# Patient Record
Sex: Female | Born: 1970 | Race: White | Hispanic: No | Marital: Married | Smoking: Never smoker
Health system: Southern US, Community
[De-identification: ages and names within clinical notes are randomized; demographics above are authoritative.]

## PROBLEM LIST (undated history)

## (undated) DIAGNOSIS — K648 Other hemorrhoids: Secondary | ICD-10-CM

## (undated) DIAGNOSIS — Z9889 Other specified postprocedural states: Secondary | ICD-10-CM

## (undated) DIAGNOSIS — K649 Unspecified hemorrhoids: Secondary | ICD-10-CM

## (undated) HISTORY — PX: OTHER SURGICAL HISTORY: SHX169

## (undated) HISTORY — DX: Other hemorrhoids: K64.8

## (undated) HISTORY — PX: WISDOM TOOTH EXTRACTION: SHX21

## (undated) HISTORY — PX: UPPER GASTROINTESTINAL ENDOSCOPY: SHX188

## (undated) HISTORY — DX: Unspecified hemorrhoids: K64.9

## (undated) HISTORY — PX: CHOLECYSTECTOMY: SHX55

---

## 2000-07-26 DIAGNOSIS — C679 Malignant neoplasm of bladder, unspecified: Secondary | ICD-10-CM

## 2000-07-26 HISTORY — DX: Malignant neoplasm of bladder, unspecified: C67.9

## 2004-05-28 ENCOUNTER — Ambulatory Visit (HOSPITAL_COMMUNITY): Admission: RE | Admit: 2004-05-28 | Discharge: 2004-05-28 | Payer: Self-pay | Admitting: Obstetrics & Gynecology

## 2004-09-09 ENCOUNTER — Ambulatory Visit: Payer: Self-pay | Admitting: Internal Medicine

## 2004-09-14 ENCOUNTER — Ambulatory Visit (HOSPITAL_COMMUNITY): Admission: RE | Admit: 2004-09-14 | Discharge: 2004-09-14 | Payer: Self-pay | Admitting: Internal Medicine

## 2004-10-14 ENCOUNTER — Ambulatory Visit: Payer: Self-pay | Admitting: Internal Medicine

## 2004-12-08 ENCOUNTER — Ambulatory Visit (HOSPITAL_COMMUNITY): Admission: RE | Admit: 2004-12-08 | Discharge: 2004-12-08 | Payer: Self-pay | Admitting: Internal Medicine

## 2004-12-08 ENCOUNTER — Ambulatory Visit: Payer: Self-pay | Admitting: Internal Medicine

## 2004-12-17 ENCOUNTER — Ambulatory Visit (HOSPITAL_COMMUNITY): Admission: RE | Admit: 2004-12-17 | Discharge: 2004-12-17 | Payer: Self-pay | Admitting: Internal Medicine

## 2004-12-24 ENCOUNTER — Ambulatory Visit (HOSPITAL_COMMUNITY): Admission: RE | Admit: 2004-12-24 | Discharge: 2004-12-24 | Payer: Self-pay | Admitting: Internal Medicine

## 2005-01-28 ENCOUNTER — Ambulatory Visit (HOSPITAL_COMMUNITY): Admission: RE | Admit: 2005-01-28 | Discharge: 2005-01-28 | Payer: Self-pay | Admitting: General Surgery

## 2005-02-08 ENCOUNTER — Ambulatory Visit: Payer: Self-pay | Admitting: Internal Medicine

## 2005-03-22 ENCOUNTER — Ambulatory Visit: Payer: Self-pay | Admitting: Internal Medicine

## 2005-03-25 ENCOUNTER — Ambulatory Visit (HOSPITAL_COMMUNITY): Admission: RE | Admit: 2005-03-25 | Discharge: 2005-03-25 | Payer: Self-pay | Admitting: Internal Medicine

## 2005-08-03 ENCOUNTER — Ambulatory Visit: Payer: Self-pay | Admitting: Internal Medicine

## 2005-08-12 ENCOUNTER — Ambulatory Visit (HOSPITAL_COMMUNITY): Admission: RE | Admit: 2005-08-12 | Discharge: 2005-08-12 | Payer: Self-pay | Admitting: Internal Medicine

## 2005-08-22 ENCOUNTER — Ambulatory Visit: Payer: Self-pay | Admitting: Internal Medicine

## 2005-09-14 ENCOUNTER — Ambulatory Visit: Payer: Self-pay | Admitting: Internal Medicine

## 2005-09-14 ENCOUNTER — Ambulatory Visit (HOSPITAL_COMMUNITY): Admission: RE | Admit: 2005-09-14 | Discharge: 2005-09-14 | Payer: Self-pay | Admitting: Internal Medicine

## 2005-11-10 ENCOUNTER — Encounter (HOSPITAL_COMMUNITY): Admission: RE | Admit: 2005-11-10 | Discharge: 2005-11-10 | Payer: Self-pay | Admitting: Internal Medicine

## 2005-11-14 IMAGING — NM NM HEPATO W/GB/PHARM/[PERSON_NAME]
2 series · 12 of 12 positions shown · non-contrast
Comparison: none

HISTORY: Epigastric pain

[Series 1: hepatobiliary · 3.20mm/px · 6 of 60 frames shown (1 of 2)]
[frame 6/60]
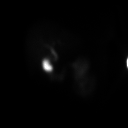
[frame 16/60]
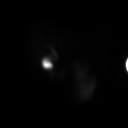
[frame 26/60]
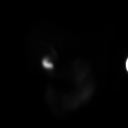
[frame 36/60]
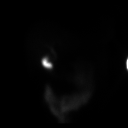
[frame 46/60]
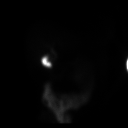
[frame 56/60]
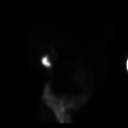

[Series 1: hepatobiliary · 3.20mm/px · 6 of 60 frames shown (2 of 2)]
[frame 6/60]
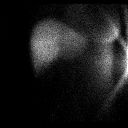
[frame 16/60]
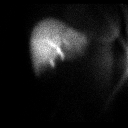
[frame 26/60]
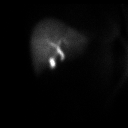
[frame 36/60]
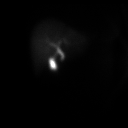
[frame 46/60]
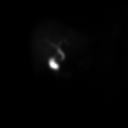
[frame 56/60]
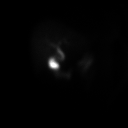

[12 of 12 positions shown; findings below may reference images not displayed]

NUCLEAR MEDICINE HEPATOBILIARY SCAN WITH EJECTION FRACTION:

Hepatobiliary imaging performed using 5 mCi Tc 99m mebrofenin.
Prompt tracer extraction from blood stream indicating normal hepatocellular
function.
Prompt excretion into biliary tree.
Gallbladder visualized x 20 minutes.
Small bowel seen x 45 minutes.
No hepatic retention of tracer.

At 1 hour, patient ingested half-and-half and imaging was performed for 60
minutes.
Only mild emptying of tracer occurs from gallbladder following fatty meal
simulation.
Calculated gallbladder ejection fraction is 29% which is abnormally low.
IMPRESSION: Patent biliary tree.
Abnormal gallbladder response to fatty meal stimulation with decreased
gallbladder ejection fraction of 29%.

## 2005-12-04 ENCOUNTER — Inpatient Hospital Stay (HOSPITAL_COMMUNITY): Admission: RE | Admit: 2005-12-04 | Discharge: 2005-12-04 | Payer: Self-pay | Admitting: Obstetrics & Gynecology

## 2005-12-26 ENCOUNTER — Ambulatory Visit: Payer: Self-pay | Admitting: Internal Medicine

## 2011-09-28 ENCOUNTER — Other Ambulatory Visit: Payer: Self-pay | Admitting: Specialist

## 2011-10-11 ENCOUNTER — Encounter: Payer: Self-pay | Admitting: Specialist

## 2011-10-27 ENCOUNTER — Other Ambulatory Visit (HOSPITAL_COMMUNITY): Payer: Self-pay | Admitting: Specialist

## 2011-10-27 DIAGNOSIS — O09529 Supervision of elderly multigravida, unspecified trimester: Secondary | ICD-10-CM

## 2011-10-27 DIAGNOSIS — Z3689 Encounter for other specified antenatal screening: Secondary | ICD-10-CM

## 2011-12-02 ENCOUNTER — Ambulatory Visit (HOSPITAL_COMMUNITY)
Admission: RE | Admit: 2011-12-02 | Discharge: 2011-12-02 | Disposition: A | Discharge status: Home / Self Care | Payer: BC Managed Care – PPO | Source: Ambulatory Visit | Attending: Specialist | Admitting: Specialist

## 2011-12-02 DIAGNOSIS — Z3689 Encounter for other specified antenatal screening: Secondary | ICD-10-CM

## 2011-12-02 DIAGNOSIS — O358XX0 Maternal care for other (suspected) fetal abnormality and damage, not applicable or unspecified: Secondary | ICD-10-CM | POA: Insufficient documentation

## 2011-12-02 DIAGNOSIS — O09529 Supervision of elderly multigravida, unspecified trimester: Secondary | ICD-10-CM | POA: Insufficient documentation

## 2011-12-02 DIAGNOSIS — Z1389 Encounter for screening for other disorder: Secondary | ICD-10-CM | POA: Insufficient documentation

## 2011-12-02 DIAGNOSIS — Z363 Encounter for antenatal screening for malformations: Secondary | ICD-10-CM | POA: Insufficient documentation

## 2011-12-05 LAB — US OB COMP LESS 14 WKS

## 2011-12-09 NOTE — Progress Notes (Signed)
Genetic Counseling  High-Risk Gestation Note  Appointment Date:  12/02/2011 Referred By: Jeannett Senior, MD Date of Birth:  Jul 21, 1971 Partner:  Carrie Mclaughlin Attending: Particia Nearing, MD   [name] and her husband, [name], were seen for genetic counseling because of a maternal age of 41 y.o.Marland Kitchen     They were counseled regarding maternal age and the association with risk for chromosome conditions due to nondisjunction with aging of the ova.   We reviewed chromosomes, nondisjunction, and the associated 1 in 33 risk for fetal aneuploidy related to a maternal age of 41 y.o. at [redacted]w[redacted]d gestation.  They were counseled that the risk for aneuploidy decreases as gestational age increases, accounting for those pregnancies which spontaneously abort.  We specifically discussed Down syndrome (trisomy 29), trisomies 80 and 62, and sex chromosome aneuploidies (47,XXX and 47,XXY) including the common features and prognoses of each.   We reviewed available screening and diagnostic options.  Regarding screening tests, we discussed the options of Quad screen and ultrasound.  They understand that screening tests are used to modify a patient's a priori risk for aneuploidy, typically based on age.  This estimate provides a pregnancy specific risk assessment. We discussed another type of screening test, noninvasive prenatal testing (NIPT), which utilizes cell free fetal DNA found in the maternal circulation. This test is not diagnostic for chromosome conditions, but can provide information regarding the presence or absence of extra fetal DNA for chromosomes 13, 18 and 21. Thus, it would not identify or rule out aneuploidy. The reported detection rate is greater than 99% for Trisomy 21, greater than 97% for Trisomy 18, and is approximately 80% (8 out of 10) for Trisomy 13. The false positive rate is thought to be less than 1% for any of these conditions. We also reviewed the availability of diagnostic options  including amniocentesis.  We discussed the risks, limitations, and benefits of each.  After reviewing these options, [name] elected to have ultrasound, but declined amniocentesis, Quad screen, and cell free fetal DNA testing.  She understands that ultrasound cannot rule out all birth defects or genetic syndromes. The patient was advised of this limitation and states she still does not want diagnostic testing at this time.  However, they were counseled that 50-80% of fetuses with Down syndrome and up to 90% of fetuses with trisomies 13 and 18, when well visualized, have detectable anomalies or soft markers by ultrasound.   [name] was provided with written information regarding cystic fibrosis (CF) including the carrier frequency and incidence in the Caucasian population, the availability of carrier testing and prenatal diagnosis if indicated.  In addition, we discussed that CF is routinely screened for as part of the [place] newborn screening panel.  She declined testing today.   Both family histories were reviewed and found to be contributory for a maternal first cousin to the patient with cleft palate. He is otherwise healthy. The underlying cause is not known for his cleft palate. The incidence of isolated cleft palate is estimated to be 1 in 2,500, and the incidence of cleft lip with or without cleft palate is approximately 1 in 1,000, varying with ethnicity. Cleft palate is most often an isolated condition, but can be present as one feature of an underlying genetic condition in combination with other birth defects or features. Clefting can also be associated with maternal environmental exposures during pregnancy. When there is no syndrome or known underlying factor as the cause,  multifactorial inheritance is suspected involving a combination of genetic and environmental contributing factors. In the case of multifactorial inheritance, given the reported family history, recurrence risk  for isolated cleft palate in the current pregnancy, a fourth degree relative to the affected individual is not estimated to be increased above the general population risk. If this relative's cleft palate were due to a specific underlying cause, this recurrence risk estimate may change. A second trimester targeted ultrasound may detect facial clefts. However, it is important to remember that not all clefting can be detected prenatally, and isolated cleft palate is difficult to detect on prenatal ultrasound.  Additionally, the patient reported a paternal great-aunt (her paternal grandmother's sister) with mental retardation. She died due in adulthood due to diabetes. An underlying cause was not known for her mental retardation. We discussed that there are many different causes of mental retardation such as genetic differences, sporadic causes, or injuries.  A specific diagnosis for mental retardation can be determined in approximately 50% of cases.  In the remaining 50% of cases, a diagnosis may not ever be determined.  Without more specific information, potential genetic risks cannot be determined.  Without further information regarding the provided family history, an accurate genetic risk cannot be calculated. Further genetic counseling is warranted if more information is obtained.  Mrs. Whetsel denied exposure to environmental toxins or chemical agents. She denied the use of alcohol, tobacco or street drugs. She denied significant viral illnesses during the course of her pregnancy. Her medical and surgical histories were noncontributory.   I counseled this couple regarding the above risks and available options.  The approximate face-to-face time with the genetic counselor was 25 minutes.  Quinn Plowman, MS,  Certified Genetic Counselor 12/09/2011

## 2012-04-12 ENCOUNTER — Other Ambulatory Visit (HOSPITAL_COMMUNITY): Payer: Self-pay | Admitting: Specialist

## 2012-04-12 DIAGNOSIS — Z3689 Encounter for other specified antenatal screening: Secondary | ICD-10-CM

## 2012-04-12 DIAGNOSIS — O2690 Pregnancy related conditions, unspecified, unspecified trimester: Secondary | ICD-10-CM

## 2012-04-13 ENCOUNTER — Ambulatory Visit (HOSPITAL_COMMUNITY)
Admission: RE | Admit: 2012-04-13 | Discharge: 2012-04-13 | Disposition: A | Discharge status: Home / Self Care | Payer: BC Managed Care – PPO | Source: Ambulatory Visit | Attending: Specialist | Admitting: Specialist

## 2012-04-13 DIAGNOSIS — O09529 Supervision of elderly multigravida, unspecified trimester: Secondary | ICD-10-CM | POA: Insufficient documentation

## 2012-04-13 DIAGNOSIS — Z3689 Encounter for other specified antenatal screening: Secondary | ICD-10-CM

## 2012-04-13 DIAGNOSIS — O2690 Pregnancy related conditions, unspecified, unspecified trimester: Secondary | ICD-10-CM

## 2012-04-13 DIAGNOSIS — O368390 Maternal care for abnormalities of the fetal heart rate or rhythm, unspecified trimester, not applicable or unspecified: Secondary | ICD-10-CM | POA: Insufficient documentation

## 2012-04-13 NOTE — Progress Notes (Signed)
Patient seen today  for follow up ultrasound.  See full report in [facility].  Alpha Gula, MD  Patient is seen today due to irregular fetal heart rate that was noted in clinic yesterday.  On exam today, frequent premature atrial contractions were noted and documented on M-mode ultrasound.  The fetal heart anatomy appears normal.  Premature atrial contractions are a benign dysrhythmia that usually resolves spontaneously after delivery.  IUP at 36 6/7 weeks Fetal growth is appropriate (88th percentile) Normal amniotic fluid volume Frequent premature atrial contactions noted on M-mode ultrasound  Recommend weekly doptones in clinic - no need for further intervention unless persistent fetal tachycardia noted (>200 bpm).  Otherwise follow up ultrasounds as clinically indicated.

## 2020-11-04 ENCOUNTER — Encounter: Payer: Self-pay | Admitting: Gastroenterology

## 2020-11-04 ENCOUNTER — Ambulatory Visit: Payer: BC Managed Care – PPO | Admitting: Gastroenterology

## 2020-11-04 VITALS — BP 120/74 | HR 72 | Ht 66.0 in | Wt 133.0 lb

## 2020-11-04 DIAGNOSIS — K625 Hemorrhage of anus and rectum: Secondary | ICD-10-CM | POA: Diagnosis not present

## 2020-11-04 MED ORDER — PLENVU 140 G PO SOLR: 1.0000 | ORAL | 0 refills | Status: DC

## 2020-11-04 NOTE — Patient Instructions (Signed)
You have been scheduled for a colonoscopy. Please follow written instructions given to you at your visit today.  Please pick up your prep supplies at the pharmacy within the next 1-3 days. If you use inhalers (even only as needed), please bring them with you on the day of your procedure.  ____________________________________________________  Carrie Mclaughlin have been scheduled for hemorrhoidal banding with Dr Loletha Carrow on 11/24/2020 @ 4:00 pm.  ____________________________________________________  If you are age 49 or younger, your body mass index should be between 19-25. Your Body mass index is 21.47 kg/m. If this is out of the aformentioned range listed, please consider follow up with your Primary Care Provider.   ____________________________________________________  Due to recent changes in healthcare laws, you may see the results of your imaging and laboratory studies on MyChart before your provider has had a chance to review them.  We understand that in some cases there may be results that are confusing or concerning to you. Not all laboratory results come back in the same time frame and the provider may be waiting for multiple results in order to interpret others.  Please give [place] 48 hours in order for your provider to thoroughly review all the results before contacting the office for clarification of your results.   It was a pleasure to see you today!  Dr. Loletha Carrow

## 2020-11-04 NOTE — Progress Notes (Signed)
[facility] Consult Note:  History: Carrie Mclaughlin 11/04/2020  Referring provider: Yvone Neu, MD  Reason for consult/chief complaint: Hemorrhoids (Pateint has hx of banding, 8 years prior,patient has active bleeding presently)   Subjective  HPI: Carrie Mclaughlin was here for painless rectal bleeding that has occurred in the past and was hemorrhoidal in nature.  It has improved for periods of time after prior banding only to recur years later.  It has been occurring again over the last several months, sometimes heavy bleeding, then none for days or up to a couple of weeks.  She denies chronic abdominal pain, her bowel habits are regular without straining.  She denies chronic upper digestive symptoms. She also feels prolapse of the tissue and that seems to cause feelings of incomplete evacuation where she may have to have a bowel movement again soon afterwards. Carrie Mclaughlin saw Dr. Gala Romney several times between 2005 and 2007 and says that hemorrhoidal banding was performed in [place] about 2012, and again several years later in [place]. ROS:  Review of Systems  Constitutional: Negative for appetite change and unexpected weight change.  HENT: Negative for mouth sores and voice change.   Eyes: Negative for pain and redness.  Respiratory: Negative for cough and shortness of breath.   Cardiovascular: Negative for chest pain and palpitations.  Genitourinary: Negative for dysuria and hematuria.  Musculoskeletal: Negative for arthralgias and myalgias.  Skin: Negative for pallor and rash.  Neurological: Negative for weakness and headaches.  Hematological: Negative for adenopathy.     Past Medical History: Past Medical History:  Diagnosis Date  . Bladder cancer ([place])   . Hemorrhoids    No chronic medical problems  Past Surgical History: No prior surgery. Hemorrhoidal banding as noted above   Family History: Family History  Problem Relation  Age of Onset  . Diabetes Mother   . Parkinson's disease Father    No family history colorectal cancer (Patient has not had prior colonoscopy)  Social History: Social History   Socioeconomic History  . Marital status: Married    Spouse name: Not on file  . Number of children: Not on file  . Years of education: Not on file  . Highest education level: Not on file  Occupational History  . Not on file  Tobacco Use  . Smoking status: Never Smoker  . Smokeless tobacco: Never Used  Substance and Sexual Activity  . Alcohol use: Not Currently  . Drug use: Never  . Sexual activity: Not on file  Other Topics Concern  . Not on file  Social History Narrative  . Not on file   Social Determinants of Health   Financial Resource Strain: Not on file  Food Insecurity: Not on file  Transportation Needs: Not on file  Physical Activity: Not on file  Stress: Not on file  Social Connections: Not on file    Allergies: No Known Allergies  Outpatient Meds: Current Outpatient Medications  Medication Sig Dispense Refill  . MULTIPLE VITAMIN PO Take 1 tablet by mouth daily.     No current facility-administered medications for this visit.      ___________________________________________________________________ Objective   Exam:  BP 120/74   Pulse 72   Ht 5\' 6"  (1.676 m)   Wt 133 lb (60.3 kg)   LMP 10/25/2020   BMI 21.47 kg/m    General: Well-appearing  Eyes: sclera anicteric, no redness  ENT: oral mucosa moist without lesions, no cervical or supraclavicular lymphadenopathy  CV: RRR without  murmur, S1/S2, no JVD, no peripheral edema  Resp: clear to auscultation bilaterally, normal RR and effort noted  GI: soft, no tenderness, with active bowel sounds. No guarding or palpable organomegaly noted.  Skin; warm and dry, no rash or jaundice noted  Neuro: awake, alert and oriented x 3. Normal gross motor function and fluent speech DRE: Redundant perianal skin folds.   Partially prolapsed internal hemorrhoid tissue seen, does not worsen with simulated defecation.  No fissure tenderness or palpable internal lesions Labs: No data for review  Assessment: Encounter Diagnosis  Name Primary?  . Rectal bleeding Yes    Recurrence of hemorrhoidal bleeding despite banding twice in the past.  Plan:  Colonoscopy to rule out any other sources of bleeding. Possible hemorrhoidal banding to follow if looks amenable to that versus colorectal surgery consult since this has recurred despite prior banding.  Thank you for the courtesy of this consult.  Please call me with any questions or concerns.  [name]  CC: Referring provider noted above

## 2020-11-17 ENCOUNTER — Encounter: Payer: BC Managed Care – PPO | Admitting: Gastroenterology

## 2020-11-24 ENCOUNTER — Encounter: Payer: BC Managed Care – PPO | Admitting: Gastroenterology

## 2020-12-07 ENCOUNTER — Telehealth: Payer: Self-pay | Admitting: Gastroenterology

## 2020-12-07 NOTE — Telephone Encounter (Signed)
Pt is wanting to inform the nurse that the medication PLENVU is on back order, pt would like to know if something else could be called in.

## 2020-12-08 NOTE — Telephone Encounter (Signed)
Patient has a procedure on 12-14-2020. Asked to return call with either a request for a new Rx or a sample of Plenvu

## 2020-12-09 ENCOUNTER — Telehealth: Payer: Self-pay | Admitting: Gastroenterology

## 2020-12-09 NOTE — Telephone Encounter (Signed)
Prep has been placed at the front desk for pick up

## 2020-12-10 ENCOUNTER — Encounter: Payer: BC Managed Care – PPO | Admitting: Gastroenterology

## 2020-12-14 ENCOUNTER — Other Ambulatory Visit: Payer: Self-pay

## 2020-12-14 ENCOUNTER — Ambulatory Visit (AMBULATORY_SURGERY_CENTER): Payer: BC Managed Care – PPO | Admitting: Gastroenterology

## 2020-12-14 ENCOUNTER — Encounter: Payer: Self-pay | Admitting: Gastroenterology

## 2020-12-14 VITALS — BP 95/59 | HR 68 | Temp 97.8°F | Resp 16 | Ht 66.0 in | Wt 133.0 lb

## 2020-12-14 DIAGNOSIS — K625 Hemorrhage of anus and rectum: Secondary | ICD-10-CM

## 2020-12-14 DIAGNOSIS — Z1211 Encounter for screening for malignant neoplasm of colon: Secondary | ICD-10-CM | POA: Diagnosis not present

## 2020-12-14 MED ORDER — SODIUM CHLORIDE 0.9 % IV SOLN: 500.0000 mL | Freq: Once | INTRAVENOUS | Status: DC

## 2020-12-14 NOTE — Progress Notes (Signed)
AR - Check-in CW- VS  

## 2020-12-14 NOTE — Progress Notes (Signed)
Pt. Reports her BP is normally below 599 systolic.

## 2020-12-14 NOTE — Op Note (Signed)
[place] [place] Patient Name: Carrie Mclaughlin Procedure Date: 12/14/2020 4:23 PM MRN: [id number] Endoscopist: Mallie Mussel L. Loletha Carrow , MD Age: 50 Referring MD:  Date of Birth: 13-Jul-1971 Gender: Female Account #: [id number] Procedure:                Colonoscopy Indications:              Screening for colorectal malignant neoplasm, , This                            is the patient's first colonoscopy                           Incidental recurrent rectal bleeding from internal                            hemorrhoids Medicines:                Monitored Anesthesia Care Procedure:                Pre-Anesthesia Assessment:                           - Prior to the procedure, a History and Physical                            was performed, and patient medications and                            allergies were reviewed. The patient's tolerance of                            previous anesthesia was also reviewed. The risks                            and benefits of the procedure and the sedation                            options and risks were discussed with the patient.                            All questions were answered, and informed consent                            was obtained. Prior Anticoagulants: The patient has                            taken no previous anticoagulant or antiplatelet                            agents. ASA Grade Assessment: II - A patient with                            mild systemic disease. After reviewing the risks  and benefits, the patient was deemed in                            satisfactory condition to undergo the procedure.                           After obtaining informed consent, the colonoscope                            was passed under direct vision. Throughout the                            procedure, the patient's blood pressure, pulse, and                            oxygen saturations were monitored continuously. The                             Olympus PFC-H190DL [id number]) Colonoscope was                            introduced through the anus and advanced to the the                            cecum, identified by appendiceal orifice and                            ileocecal valve. The colonoscopy was performed                            without difficulty. The patient tolerated the                            procedure well. The quality of the bowel                            preparation was good. The ileocecal valve,                            appendiceal orifice, and rectum were photographed.                            The bowel preparation used was Plenvu. Scope In: 4:33:34 PM Scope Out: 4:48:28 PM Scope Withdrawal Time: 0 hours 10 minutes 13 seconds  Total Procedure Duration: 0 hours 14 minutes 54 seconds  Findings:                 The perianal and digital rectal examinations were                            normal.                           There is no endoscopic evidence of polyps in the  entire colon.                           Internal hemorrhoids were found. The hemorrhoids                            were large.                           The exam was otherwise without abnormality on                            direct and retroflexion views. Complications:            No immediate complications. Estimated Blood Loss:     Estimated blood loss: none. Impression:               - Internal hemorrhoids.                           - The examination was otherwise normal on direct                            and retroflexion views.                           - No specimens collected. Recommendation:           - Patient has a contact number available for                            emergencies. The signs and symptoms of potential                            delayed complications were discussed with the                            patient. Return to normal activities tomorrow.                             Written discharge instructions were provided to the                            patient.                           - Resume previous diet.                           - Continue present medications.                           - Repeat colonoscopy in 10 years for screening                            purposes.                           - Return to my office  for hemorrhoidal banding. Henry L. Loletha Carrow, MD 12/14/2020 4:53:08 PM This report has been signed electronically.

## 2020-12-14 NOTE — Patient Instructions (Signed)
Impression/Recommendations:  Hemorrhoid handout given to patient.  Resume previous diet. Continue present medications.  Repeat colonoscopy in 10 years for screening purposes.  YOU HAD AN ENDOSCOPIC PROCEDURE TODAY AT THE [facility]:   Refer to the procedure report that was given to you for any specific questions about what was found during the examination.  If the procedure report does not answer your questions, please call your gastroenterologist to clarify.  If you requested that your care partner not be given the details of your procedure findings, then the procedure report has been included in a sealed envelope for you to review at your convenience later.  YOU SHOULD EXPECT: Some feelings of bloating in the abdomen. Passage of more gas than usual.  Walking can help get rid of the air that was put into your GI tract during the procedure and reduce the bloating. If you had a lower endoscopy (such as a colonoscopy or flexible sigmoidoscopy) you may notice spotting of blood in your stool or on the toilet paper. If you underwent a bowel prep for your procedure, you may not have a normal bowel movement for a few days.  Please Note:  You might notice some irritation and congestion in your nose or some drainage.  This is from the oxygen used during your procedure.  There is no need for concern and it should clear up in a day or so.  SYMPTOMS TO REPORT IMMEDIATELY:   Following lower endoscopy (colonoscopy or flexible sigmoidoscopy):  Excessive amounts of blood in the stool  Significant tenderness or worsening of abdominal pains  Swelling of the abdomen that is new, acute  Fever of 100F or higher  For urgent or emergent issues, a gastroenterologist can be reached at any hour by calling [phone]. Do not use MyChart messaging for urgent concerns.    DIET:  We do recommend a small meal at first, but then you may proceed to your regular diet.  Drink plenty of fluids but you  should avoid alcoholic beverages for 24 hours.  ACTIVITY:  You should plan to take it easy for the rest of today and you should NOT DRIVE or use heavy machinery until tomorrow (because of the sedation medicines used during the test).    FOLLOW UP: Our staff will call the number listed on your records 48-72 hours following your procedure to check on you and address any questions or concerns that you may have regarding the information given to you following your procedure. If we do not reach you, we will leave a message.  We will attempt to reach you two times.  During this call, we will ask if you have developed any symptoms of COVID 19. If you develop any symptoms (ie: fever, flu-like symptoms, shortness of breath, cough etc.) before then, please call [phone].  If you test positive for Covid 19 in the 2 weeks post procedure, please call and report this information to us.    If any biopsies were taken you will be contacted by phone or by letter within the next 1-3 weeks.  Please call us at [phone] if you have not heard about the biopsies in 3 weeks.    SIGNATURES/CONFIDENTIALITY: You and/or your care partner have signed paperwork which will be entered into your electronic medical record.  These signatures attest to the fact that that the information above on your After Visit Summary has been reviewed and is understood.  Full responsibility of the confidentiality of this discharge information lies   with you and/or your care-partner.

## 2020-12-14 NOTE — Progress Notes (Signed)
PT taken to PACU. Monitors in place. VSS. Report given to RN. 

## 2020-12-15 ENCOUNTER — Telehealth: Payer: Self-pay

## 2020-12-15 NOTE — Telephone Encounter (Signed)
Per 12/14/20 procedure note - return to office for hemorrhoidal banding.  Lm on vm for patient to return call to schedule.

## 2020-12-16 ENCOUNTER — Telehealth: Payer: Self-pay | Admitting: *Deleted

## 2020-12-16 ENCOUNTER — Telehealth: Payer: Self-pay

## 2020-12-16 NOTE — Telephone Encounter (Signed)
Called [phone] and left a message we tried to reach pt for a follow up call. maw

## 2020-12-16 NOTE — Telephone Encounter (Signed)
  Follow up Call-  Call back number 12/14/2020  Post procedure Call Back phone  # #[phone] cell  Permission to leave phone message Yes  Some recent data might be hidden     Patient questions:  Do you have a fever, pain , or abdominal swelling? No. Pain Score  0 *  Have you tolerated food without any problems? Yes.    Have you been able to return to your normal activities? Yes.    Do you have any questions about your discharge instructions: Diet   No. Medications  No. Follow up visit  No.  Do you have questions or concerns about your Care? No.  Actions: * If pain score is 4 or above: No action needed, pain <4.  1. Have you developed a fever since your procedure? no  2.   Have you had an respiratory symptoms (SOB or cough) since your procedure? no  3.   Have you tested positive for COVID 19 since your procedure no  4.   Have you had any family members/close contacts diagnosed with the COVID 19 since your procedure?  no   If yes to any of these questions please route to Joylene John, RN and Joella Prince, RN

## 2020-12-17 NOTE — Telephone Encounter (Signed)
Lm on mobile vm for patient to return call °

## 2020-12-21 NOTE — Telephone Encounter (Signed)
Patient has been scheduled for hemorrhoid banding #3 on Thursday, 01/07/21 at 4 PM.

## 2020-12-28 ENCOUNTER — Encounter: Payer: BC Managed Care – PPO | Admitting: Gastroenterology

## 2021-01-07 ENCOUNTER — Ambulatory Visit: Payer: BC Managed Care – PPO | Admitting: Gastroenterology

## 2021-01-07 ENCOUNTER — Encounter: Payer: Self-pay | Admitting: Gastroenterology

## 2021-01-07 VITALS — BP 94/64 | HR 80 | Ht 66.0 in | Wt 136.1 lb

## 2021-01-07 DIAGNOSIS — K648 Other hemorrhoids: Secondary | ICD-10-CM | POA: Diagnosis not present

## 2021-01-07 NOTE — Progress Notes (Signed)
PROCEDURE NOTE: The patient presents with symptomatic grade 2 hemorrhoids, requesting rubber band ligation of his/her hemorrhoidal disease. All risks, benefits and alternative forms of therapy were described and informed consent was obtained.  DRE revealed: redundant external tissue Anoscopy: 3 columns large HR   The anorectum was pre-medicated with 0.125% NTG and lubricant. The decision was made to band the RP internal hemorrhoids, and the [place] was used to perform band ligation without complication. Digital anorectal examination was then performed to assure proper positioning of the band, and to adjust the banded tissue as required. The patient was discharged home without pain or other issues. Dietary and behavioral recommendations were given and along with follow-up instructions.   The following adjunctive treatments were recommended:  none  The patient will return 2-3 weeks  for follow-up and possible additional banding as required. No complications were encountered and the patient tolerated the procedure well.

## 2021-01-07 NOTE — Patient Instructions (Signed)
If you are age 50 or older, your body mass index should be between 23-30. Your Body mass index is 21.97 kg/m. If this is out of the aforementioned range listed, please consider follow up with your Primary Care Provider.  If you are age 80 or younger, your body mass index should be between 19-25. Your Body mass index is 21.97 kg/m. If this is out of the aformentioned range listed, please consider follow up with your Primary Care Provider.   HEMORRHOID BANDING PROCEDURE    FOLLOW-UP CARE   1. The procedure you have had should have been relatively painless since the banding of the area involved does not have nerve endings and there is no pain sensation.  The rubber band cuts off the blood supply to the hemorrhoid and the band may fall off as soon as 48 hours after the banding (the band may occasionally be seen in the toilet bowl following a bowel movement). You may notice a temporary feeling of fullness in the rectum which should respond adequately to plain Tylenol or Motrin.  2. Following the banding, avoid strenuous exercise that evening and resume full activity the next day.  A sitz bath (soaking in a warm tub) or bidet is soothing, and can be useful for cleansing the area after bowel movements.     3. To avoid constipation, take two tablespoons of natural wheat bran, natural oat bran, flax, Benefiber or any over the counter fiber supplement and increase your water intake to 7-8 glasses daily.    4. Unless you have been prescribed anorectal medication, do not put anything inside your rectum for two weeks: No suppositories, enemas, fingers, etc.  5. Occasionally, you may have more bleeding than usual after the banding procedure.  This is often from the untreated hemorrhoids rather than the treated one.  Don't be concerned if there is a tablespoon or so of blood.  If there is more blood than this, lie flat with your bottom higher than your head and apply an ice pack to the area. If the bleeding  does not stop within a half an hour or if you feel faint, call our office at [phone] or go to the emergency room.  6. Problems are not common; however, if there is a substantial amount of bleeding, severe pain, chills, fever or difficulty passing urine (very rare) or other problems, you should call us at [phone] or report to the nearest emergency room.  7. Do not stay seated continuously for more than 2-3 hours for a day or two after the procedure.  Tighten your buttock muscles 10-15 times every two hours and take 10-15 deep breaths every 1-2 hours.  Do not spend more than a few minutes on the toilet if you cannot empty your bowel; instead re-visit the toilet at a later time.     It was a pleasure to see you today!  Dr. Loletha Carrow

## 2021-01-20 ENCOUNTER — Encounter: Payer: Self-pay | Admitting: *Deleted

## 2021-01-25 ENCOUNTER — Encounter: Payer: Self-pay | Admitting: Gastroenterology

## 2021-01-25 ENCOUNTER — Ambulatory Visit (INDEPENDENT_AMBULATORY_CARE_PROVIDER_SITE_OTHER): Payer: BC Managed Care – PPO | Admitting: Gastroenterology

## 2021-01-25 DIAGNOSIS — K648 Other hemorrhoids: Secondary | ICD-10-CM | POA: Diagnosis not present

## 2021-01-25 NOTE — Progress Notes (Signed)
PROCEDURE NOTE: The patient presents with symptomatic grade 2 hemorrhoids, requesting rubber band ligation of his/her hemorrhoidal disease. All risks, benefits and alternative forms of therapy were described and informed consent was obtained.  Bleeding significantly subsided after first banding.  DRE revealed: redundant skin folds.  No prolapse of tissue when bearing down.   The anorectum was pre-medicated with 0.125% NTG and lubricant. The decision was made to band the RA internal hemorrhoids, and the [place] was used to perform band ligation without complication. Digital anorectal examination was then performed to assure proper positioning of the band, and to adjust the banded tissue as required. The patient was discharged home without pain or other issues. Dietary and behavioral recommendations were given and along with follow-up instructions.   The following adjunctive treatments were recommended:  none  The patient will return 3-4 weeks  for follow-up and possible additional banding as required. No complications were encountered and the patient tolerated the procedure well.

## 2021-01-25 NOTE — Patient Instructions (Signed)
You have been scheduled for your 3rd hemorrhoidal banding on 03/10/21 at 4:00 pm.  HEMORRHOID BANDING PROCEDURE    FOLLOW-UP CARE   1. The procedure you have had should have been relatively painless since the banding of the area involved does not have nerve endings and there is no pain sensation.  The rubber band cuts off the blood supply to the hemorrhoid and the band may fall off as soon as 48 hours after the banding (the band may occasionally be seen in the toilet bowl following a bowel movement). You may notice a temporary feeling of fullness in the rectum which should respond adequately to plain Tylenol or Motrin.  2. Following the banding, avoid strenuous exercise that evening and resume full activity the next day.  A sitz bath (soaking in a warm tub) or bidet is soothing, and can be useful for cleansing the area after bowel movements.     3. To avoid constipation, take two tablespoons of natural wheat bran, natural oat bran, flax, Benefiber or any over the counter fiber supplement and increase your water intake to 7-8 glasses daily.    4. Unless you have been prescribed anorectal medication, do not put anything inside your rectum for two weeks: No suppositories, enemas, fingers, etc.  5. Occasionally, you may have more bleeding than usual after the banding procedure.  This is often from the untreated hemorrhoids rather than the treated one.  Don't be concerned if there is a tablespoon or so of blood.  If there is more blood than this, lie flat with your bottom higher than your head and apply an ice pack to the area. If the bleeding does not stop within a half an hour or if you feel faint, call our office at [phone] or go to the emergency room.  6. Problems are not common; however, if there is a substantial amount of bleeding, severe pain, chills, fever or difficulty passing urine (very rare) or other problems, you should call us at [phone] or report to the nearest emergency  room.  7. Do not stay seated continuously for more than 2-3 hours for a day or two after the procedure.  Tighten your buttock muscles 10-15 times every two hours and take 10-15 deep breaths every 1-2 hours.  Do not spend more than a few minutes on the toilet if you cannot empty your bowel; instead re-visit the toilet at a later time.   If you are age 50 or older, your body mass index should be between 23-30. Your Body mass index is 21.62 kg/m. If this is out of the aforementioned range listed, please consider follow up with your Primary Care Provider.  If you are age 60 or younger, your body mass index should be between 19-25. Your Body mass index is 21.62 kg/m. If this is out of the aformentioned range listed, please consider follow up with your Primary Care Provider.   Due to recent changes in healthcare laws, you may see the results of your imaging and laboratory studies on MyChart before your provider has had a chance to review them.  We understand that in some cases there may be results that are confusing or concerning to you. Not all laboratory results come back in the same time frame and the provider may be waiting for multiple results in order to interpret others.  Please give [place] 48 hours in order for your provider to thoroughly review all the results before contacting the office for clarification of  your results.   It was a pleasure to see you today!  Dr. Loletha Carrow

## 2021-02-11 ENCOUNTER — Other Ambulatory Visit: Payer: Self-pay | Admitting: Gastroenterology

## 2021-02-11 DIAGNOSIS — K648 Other hemorrhoids: Secondary | ICD-10-CM

## 2021-02-11 MED ORDER — HYDROCORTISONE ACETATE 25 MG RE SUPP: 25.0000 mg | Freq: Two times a day (BID) | RECTAL | 0 refills | Status: DC | PRN

## 2021-02-12 ENCOUNTER — Other Ambulatory Visit: Payer: Self-pay | Admitting: Gastroenterology

## 2021-02-12 NOTE — Telephone Encounter (Signed)
I have no idea why the pharmacy says they will not fill this, claiming that it is not FDA approved.  Please tell the patient by portal message she can either choose to pay out-of-pocket or use the over-the-counter hydrocortisone suppositories.  - HD

## 2021-03-10 ENCOUNTER — Encounter: Payer: Self-pay | Admitting: Gastroenterology

## 2021-03-10 ENCOUNTER — Ambulatory Visit: Payer: BC Managed Care – PPO | Admitting: Gastroenterology

## 2021-03-10 VITALS — BP 110/70 | HR 72 | Ht 66.0 in | Wt 134.0 lb

## 2021-03-10 DIAGNOSIS — K648 Other hemorrhoids: Secondary | ICD-10-CM | POA: Diagnosis not present

## 2021-03-10 NOTE — Progress Notes (Signed)
PROCEDURE NOTE: The patient presents with symptomatic grade 2 hemorrhoids, requesting rubber band ligation of his/her hemorrhoidal disease. All risks, benefits and alternative forms of therapy were described and informed consent was obtained.  DRE revealed: Redundant perianal skin as before  Anoscopy showed prominent RP column (despite prior banding), no residual RA calm, very small LL column.   The anorectum was pre-medicated with 0.125% NTG and lubricant. The decision was made to band the RP internal hemorrhoids again, and the [place] was used to perform band ligation without complication. Digital anorectal examination was then performed to assure proper positioning of the band, and to adjust the banded tissue as required. The patient was discharged home without pain or other issues. Dietary and behavioral recommendations were given and along with follow-up instructions.   The following adjunctive treatments were recommended:  Patient is using a natural hemorrhoidal cream that she bought at the health food store.  It appears to have various herbal ingredients.  We had a discussion regarding long-term management of this.  If she has recurrent symptoms of hemorrhoidal prolapse with pain and bleeding as before, she needs colorectal surgery evaluation for definitive surgical therapy.

## 2021-03-10 NOTE — Patient Instructions (Signed)
If you are age 50 or older, your body mass index should be between 23-30. Your Body mass index is 21.63 kg/m. If this is out of the aforementioned range listed, please consider follow up with your Primary Care Provider.  If you are age 18 or younger, your body mass index should be between 19-25. Your Body mass index is 21.63 kg/m. If this is out of the aformentioned range listed, please consider follow up with your Primary Care Provider.   HEMORRHOID BANDING PROCEDURE    FOLLOW-UP CARE   1. The procedure you have had should have been relatively painless since the banding of the area involved does not have nerve endings and there is no pain sensation.  The rubber band cuts off the blood supply to the hemorrhoid and the band may fall off as soon as 48 hours after the banding (the band may occasionally be seen in the toilet bowl following a bowel movement). You may notice a temporary feeling of fullness in the rectum which should respond adequately to plain Tylenol or Motrin.  2. Following the banding, avoid strenuous exercise that evening and resume full activity the next day.  A sitz bath (soaking in a warm tub) or bidet is soothing, and can be useful for cleansing the area after bowel movements.     3. To avoid constipation, take two tablespoons of natural wheat bran, natural oat bran, flax, Benefiber or any over the counter fiber supplement and increase your water intake to 7-8 glasses daily.    4. Unless you have been prescribed anorectal medication, do not put anything inside your rectum for two weeks: No suppositories, enemas, fingers, etc.  5. Occasionally, you may have more bleeding than usual after the banding procedure.  This is often from the untreated hemorrhoids rather than the treated one.  Don't be concerned if there is a tablespoon or so of blood.  If there is more blood than this, lie flat with your bottom higher than your head and apply an ice pack to the area. If the bleeding  does not stop within a half an hour or if you feel faint, call our office at [phone] or go to the emergency room.  6. Problems are not common; however, if there is a substantial amount of bleeding, severe pain, chills, fever or difficulty passing urine (very rare) or other problems, you should call us at [phone] or report to the nearest emergency room.  7. Do not stay seated continuously for more than 2-3 hours for a day or two after the procedure.  Tighten your buttock muscles 10-15 times every two hours and take 10-15 deep breaths every 1-2 hours.  Do not spend more than a few minutes on the toilet if you cannot empty your bowel; instead re-visit the toilet at a later time.    It was a pleasure to see you today!  Dr. Loletha Carrow

## 2021-07-12 NOTE — Telephone Encounter (Signed)
Please send referral to Dr. Leighton Ruff at [place] for bleeding internal hemorrhoids, and sent portal message to the patient with that information so she is aware.  - HD

## 2021-07-13 NOTE — Telephone Encounter (Signed)
Referral, records, demographic and insurance information faxed to Dr. Marcello Moores at [place]. Pt notified via my chart.

## 2021-08-03 NOTE — Telephone Encounter (Signed)
New referral faxed to Dr. Aviva Signs at [facility] along with records, demographic and insurance information. Pt notified via my chart.

## 2021-08-19 ENCOUNTER — Encounter: Payer: Self-pay | Admitting: General Surgery

## 2021-08-19 ENCOUNTER — Other Ambulatory Visit: Payer: Self-pay

## 2021-08-19 ENCOUNTER — Ambulatory Visit: Payer: BC Managed Care – PPO | Admitting: General Surgery

## 2021-08-19 VITALS — BP 115/78 | HR 62 | Temp 98.5°F | Resp 12 | Ht 66.5 in | Wt 137.0 lb

## 2021-08-19 DIAGNOSIS — K648 Other hemorrhoids: Secondary | ICD-10-CM | POA: Diagnosis not present

## 2021-08-19 NOTE — Progress Notes (Signed)
Carrie Mclaughlin; [id number]; [birth year]   HPI Patient is a 50 year old white female who was referred to my care by Dr. Laney Pastor for evaluation and treatment of hemorrhoidal disease.  She has had hemorrhoidal issues for many years.  This includes intermittent bleeding, prolapse, and pain.  She has undergone banding in the past, the last banding done in April of this year.  She has had progression of her hemorrhoidal disease.  She has tried to push the hemorrhoids back in, but now this is becoming more difficult.  She is now using an herbal cream to help with the hemorrhoids.  She has had multiple children in the past.  This has been a chronic problem for her.  She has never had surgery for her hemorrhoids.  Her bowel movements are regular and she denies constipation. Past Medical History:  Diagnosis Date   Bladder cancer ([place]) 07/26/2000   Hemorrhoids    Internal hemorrhoids     Past Surgical History:  Procedure Laterality Date   Bladder cancer     Removal of tumor   CHOLECYSTECTOMY     UPPER GASTROINTESTINAL ENDOSCOPY     WISDOM TOOTH EXTRACTION      Family History  Problem Relation Age of Onset   Diabetes Mother    Colon polyps Mother    Parkinson's disease Father    Colon polyps Father    Pancreatic cancer Other    Colon cancer Neg Hx    Esophageal cancer Neg Hx    Rectal cancer Neg Hx    Stomach cancer Neg Hx     Current Outpatient Medications on File Prior to Visit  Medication Sig Dispense Refill   doxylamine, Sleep, (UNISOM) 25 MG tablet Take 25 mg by mouth at bedtime as needed.     MULTIPLE VITAMIN PO Take 1 tablet by mouth daily.     No current facility-administered medications on file prior to visit.    No Known Allergies  Social History   Substance and Sexual Activity  Alcohol Use Not Currently    Social History   Tobacco Use  Smoking Status Never  Smokeless Tobacco Never    Review of Systems  Constitutional: Negative.   HENT: Negative.    Eyes:  Negative.   Respiratory: Negative.    Cardiovascular: Negative.   Gastrointestinal: Negative.   Genitourinary: Negative.   Musculoskeletal: Negative.   Skin: Negative.   Neurological: Negative.   Endo/Heme/Allergies: Negative.   Psychiatric/Behavioral: Negative.     Objective   Vitals:   08/19/21 0926  BP: 115/78  Pulse: 62  Resp: 12  Temp: 98.5 F (36.9 C)  SpO2: 98%    Physical Exam Vitals reviewed.  Constitutional:      Appearance: Normal appearance. She is normal weight. She is not ill-appearing.  HENT:     Head: Normocephalic and atraumatic.  Cardiovascular:     Rate and Rhythm: Normal rate and regular rhythm.     Heart sounds: Normal heart sounds. No murmur heard.   No friction rub. No gallop.  Pulmonary:     Effort: Pulmonary effort is normal. No respiratory distress.     Breath sounds: Normal breath sounds. No stridor. No wheezing, rhonchi or rales.  Genitourinary:    Comments: Prominent internal and external hemorrhoids along the right side of the anus.  No active bleeding noted.  Examination limited secondary to her prominent hemorrhoidal disease. Skin:    General: Skin is warm and dry.  Neurological:     Mental Status: She  is alert and oriented to person, place, and time.    Assessment  Internal and external prolapsing hemorrhoids.  Failure of conservative measures. Plan  Patient is scheduled for an extensive hemorrhoidectomy on 10/11/2021.  The risks and benefits of the procedure including bleeding, infection, pain, and the possibility of recurrence of the hemorrhoids were fully explained to the patient, who gave informed consent.  I told her that we may have to to proceed with a staged procedure as she has significant hemorrhoidal disease and I do not want her to develop an anal stricture.  Literature was given.

## 2021-09-16 NOTE — H&P (Signed)
Carrie Mclaughlin; [id number]; [birth year]   HPI Patient is a 50 year old white female who was referred to my care by Dr. Laney Pastor for evaluation and treatment of hemorrhoidal disease.  She has had hemorrhoidal issues for many years.  This includes intermittent bleeding, prolapse, and pain.  She has undergone banding in the past, the last banding done in April of this year.  She has had progression of her hemorrhoidal disease.  She has tried to push the hemorrhoids back in, but now this is becoming more difficult.  She is now using an herbal cream to help with the hemorrhoids.  She has had multiple children in the past.  This has been a chronic problem for her.  She has never had surgery for her hemorrhoids.  Her bowel movements are regular and she denies constipation. Past Medical History:  Diagnosis Date   Bladder cancer ([place]) 07/26/2000   Hemorrhoids    Internal hemorrhoids     Past Surgical History:  Procedure Laterality Date   Bladder cancer     Removal of tumor   CHOLECYSTECTOMY     UPPER GASTROINTESTINAL ENDOSCOPY     WISDOM TOOTH EXTRACTION      Family History  Problem Relation Age of Onset   Diabetes Mother    Colon polyps Mother    Parkinson's disease Father    Colon polyps Father    Pancreatic cancer Other    Colon cancer Neg Hx    Esophageal cancer Neg Hx    Rectal cancer Neg Hx    Stomach cancer Neg Hx     Current Outpatient Medications on File Prior to Visit  Medication Sig Dispense Refill   doxylamine, Sleep, (UNISOM) 25 MG tablet Take 25 mg by mouth at bedtime as needed.     MULTIPLE VITAMIN PO Take 1 tablet by mouth daily.     No current facility-administered medications on file prior to visit.    No Known Allergies  Social History   Substance and Sexual Activity  Alcohol Use Not Currently    Social History   Tobacco Use  Smoking Status Never  Smokeless Tobacco Never    Review of Systems  Constitutional: Negative.   HENT: Negative.    Eyes:  Negative.   Respiratory: Negative.    Cardiovascular: Negative.   Gastrointestinal: Negative.   Genitourinary: Negative.   Musculoskeletal: Negative.   Skin: Negative.   Neurological: Negative.   Endo/Heme/Allergies: Negative.   Psychiatric/Behavioral: Negative.     Objective   Vitals:   08/19/21 0926  BP: 115/78  Pulse: 62  Resp: 12  Temp: 98.5 F (36.9 C)  SpO2: 98%    Physical Exam Vitals reviewed.  Constitutional:      Appearance: Normal appearance. She is normal weight. She is not ill-appearing.  HENT:     Head: Normocephalic and atraumatic.  Cardiovascular:     Rate and Rhythm: Normal rate and regular rhythm.     Heart sounds: Normal heart sounds. No murmur heard.   No friction rub. No gallop.  Pulmonary:     Effort: Pulmonary effort is normal. No respiratory distress.     Breath sounds: Normal breath sounds. No stridor. No wheezing, rhonchi or rales.  Genitourinary:    Comments: Prominent internal and external hemorrhoids along the right side of the anus.  No active bleeding noted.  Examination limited secondary to her prominent hemorrhoidal disease. Skin:    General: Skin is warm and dry.  Neurological:     Mental Status: She  is alert and oriented to person, place, and time.    Assessment  Internal and external prolapsing hemorrhoids.  Failure of conservative measures. Plan  Patient is scheduled for an extensive hemorrhoidectomy on 10/11/2021.  The risks and benefits of the procedure including bleeding, infection, pain, and the possibility of recurrence of the hemorrhoids were fully explained to the patient, who gave informed consent.  I told her that we may have to to proceed with a staged procedure as she has significant hemorrhoidal disease and I do not want her to develop an anal stricture.  Literature was given.

## 2021-10-04 NOTE — Patient Instructions (Addendum)
EESHA SCHMALTZ  10/04/2021     @PREFPERIOPPHARMACY @   Your procedure is scheduled on 10/11/2021.  Report to [facility] at [phone] A.M.  Call this number if you have problems the morning of surgery:  [phone]   Remember:  Do not eat or drink after midnight.     Take these medicines the morning of surgery with A SIP OF WATER : none    Do not wear jewelry, make-up or nail polish.  Do not wear lotions, powders, or perfumes, or deodorant.  Do not shave 48 hours prior to surgery.  Men may shave face and neck.  Do not bring valuables to the hospital.  [facility] is not responsible for any belongings or valuables.  Contacts, dentures or bridgework may not be worn into surgery.  Leave your suitcase in the car.  After surgery it may be brought to your room.  For patients admitted to the hospital, discharge time will be determined by your treatment team.  Patients discharged the day of surgery will not be allowed to drive home.   Name and phone number of your driver:   Family Special instructions:  N/A  Please read over the following fact sheets that you were given. Care and Recovery After Surgery    Surgical Procedures for Hemorrhoids, Care After This sheet gives you information about how to care for yourself after your procedure. Your health care provider may also give you more specific instructions. If you have problems or questions, contact your health care provider. What can I expect after the procedure? After the procedure, it is common to have: Rectal pain. Pain when you are having a bowel movement. Slight rectal bleeding. This is more likely to happen with the first bowel movement after surgery. Follow these instructions at home: Medicines Take over-the-counter and prescription medicines only as told by your health care provider. If you were prescribed an antibiotic medicine, use it as told by your health care provider. Do not stop using the antibiotic  even if your condition improves. Ask your health care provider if the medicine prescribed to you requires you to avoid driving or using heavy machinery. Use a stool softener or a bulk laxative as told by your health care provider. Eating and drinking Follow instructions from your health care provider about what to eat or drink after your procedure. You may need to take actions to prevent or treat constipation, such as: Drink enough fluid to keep your urine pale yellow. Take over-the-counter or prescription medicines. Eat foods that are high in fiber, such as beans, whole grains, and fresh fruits and vegetables. Limit foods that are high in fat and processed sugars, such as fried or sweet foods. Activity  Rest as told by your health care provider. Avoid sitting for a long time without moving. Get up to take short walks every 1-2 hours. This is important to improve blood flow and breathing. Ask for help if you feel weak or unsteady. Return to your normal activities as told by your health care provider. Ask your health care provider what activities are safe for you. Do not lift anything that is heavier than 10 lb (4.5 kg), or the limit that you are told, until your health care provider says that it is safe. Do not strain to have a bowel movement. Do not spend a long time sitting on the toilet. General instructions  Take warm sitz baths for 15-20 minutes, 2-3 times  a day to relieve soreness or itching and to keep the rectal area clean. Apply ice packs to the area to reduce swelling and pain. Do not drive for 24 hours if you were given a sedative during your procedure. Keep all follow-up visits as told by your health care provider. This is important. Contact a health care provider if: Your pain medicine is not helping. You have a fever or chills. You have bad smelling drainage. You have a lot of swelling. You become constipated. You have trouble passing urine. Get help right away if: You  have very bad rectal pain. You have heavy bleeding from your rectum. Summary After the procedure, it is common to have pain and slight rectal bleeding. Take warm sitz baths for 15-20 minutes, 2-3 times a day to relieve soreness or itching and to keep the rectal area clean. Avoid straining when having a bowel movement. Eat foods that are high in fiber, such as beans, whole grains, and fresh fruits and vegetables. Take over-the-counter and prescription medicines only as told by your health care provider. This information is not intended to replace advice given to you by your health care provider. Make sure you discuss any questions you have with your health care provider. Document Revised: 05/12/2021 Document Reviewed: 05/12/2021 Elsevier Patient Education  [place].  Chlorhexidine Topical Solution What is this medication? CHLORHEXIDINE (klor HEX i deen) prevents skin infection. It is often used to clean and disinfect the skin after an injury or before a procedure. It works by killing or preventing the growth of bacteria on the skin. This medicine may be used for other purposes; ask your health care provider or pharmacist if you have questions. COMMON BRAND NAME(S): Betasept, Chlorostat, Hibiclens What should I tell my care team before I take this medication? They need to know if you have any of the following conditions: Any skin rashes or problems An unusual or allergic reaction to chlorhexidine, other medications, foods, dyes, or preservatives Pregnant or trying to get pregnant Breast-feeding How should I use this medication? This medication is for external use only. Do not take by mouth. Follow the directions on the label or those given to you by your care team. Keep out of eyes, ears and mouth. This medication should not be used as a preoperative skin preparation of the face or head. Talk to your care team about the use of this medication in children. While this medication may be  used for children for selected conditions, precautions do apply. Overdosage: If you think you have taken too much of this medicine contact a poison control center or emergency room at once. NOTE: This medicine is only for you. Do not share this medicine with others. What if I miss a dose? This does not apply; this medication is not for regular use. What may interact with this medication? Interactions are not expected. This list may not describe all possible interactions. Give your health care provider a list of all the medicines, herbs, non-prescription drugs, or dietary supplements you use. Also tell them if you smoke, drink alcohol, or use illegal drugs. Some items may interact with your medicine. What should I watch for while using this medication? This medication may cause severe allergic reactions. Notify your care team right away if you think you are having an allergic reaction. Do not take this medication by mouth. Avoid contact with your ears and eyes. If contact with the eyes occur, rinse the eyes well with plenty of cool tap water.  What side effects may I notice from receiving this medication? Side effects that you should report to your care team as soon as possible: Allergic reactions--skin rash, itching, hives, swelling of the face, lips, tongue, or throat Side effects that usually do not require medical attention (report to your care team if they continue or are bothersome): Mild skin irritation, redness, or dryness This list may not describe all possible side effects. Call your doctor for medical advice about side effects. You may report side effects to FDA at 1-800-FDA-1088. Where should I keep my medication? Keep out of the reach of children. Store at room temperature between 15 and 30 degrees C (59 and 86 degrees F). Store away from direct light and heat. Do not freeze. Throw away any unused medication after the expiration date. NOTE: This sheet is a summary. It may not cover all  possible information. If you have questions about this medicine, talk to your doctor, pharmacist, or health care provider.  2022 Elsevier/Gold Standard (2021-01-08 00:00:00)

## 2021-10-06 ENCOUNTER — Other Ambulatory Visit: Payer: Self-pay

## 2021-10-06 ENCOUNTER — Encounter (HOSPITAL_COMMUNITY): Payer: Self-pay

## 2021-10-06 ENCOUNTER — Encounter (HOSPITAL_COMMUNITY)
Admission: RE | Admit: 2021-10-06 | Discharge: 2021-10-06 | Disposition: A | Discharge status: Home / Self Care | Payer: BC Managed Care – PPO | Source: Ambulatory Visit | Attending: General Surgery | Admitting: General Surgery

## 2021-10-06 VITALS — BP 116/83 | HR 72 | Temp 98.5°F | Resp 18 | Ht 66.05 in | Wt 135.0 lb

## 2021-10-06 DIAGNOSIS — Z01812 Encounter for preprocedural laboratory examination: Secondary | ICD-10-CM | POA: Diagnosis not present

## 2021-10-06 DIAGNOSIS — Z01818 Encounter for other preprocedural examination: Secondary | ICD-10-CM

## 2021-10-06 LAB — PREGNANCY, URINE: Preg Test, Ur: NEGATIVE

## 2021-10-11 ENCOUNTER — Ambulatory Visit (HOSPITAL_COMMUNITY): Payer: BC Managed Care – PPO | Admitting: Anesthesiology

## 2021-10-11 ENCOUNTER — Encounter (HOSPITAL_COMMUNITY): Payer: Self-pay | Admitting: General Surgery

## 2021-10-11 ENCOUNTER — Ambulatory Visit (HOSPITAL_COMMUNITY)
Admission: RE | Admit: 2021-10-11 | Discharge: 2021-10-11 | Disposition: A | Discharge status: Home / Self Care | Payer: BC Managed Care – PPO | Attending: General Surgery | Admitting: General Surgery

## 2021-10-11 ENCOUNTER — Encounter (HOSPITAL_COMMUNITY)
Admission: RE | Disposition: A | Discharge status: Home / Self Care | Payer: Self-pay | Source: Home / Self Care | Attending: General Surgery

## 2021-10-11 DIAGNOSIS — K648 Other hemorrhoids: Secondary | ICD-10-CM | POA: Insufficient documentation

## 2021-10-11 DIAGNOSIS — K644 Residual hemorrhoidal skin tags: Secondary | ICD-10-CM | POA: Diagnosis not present

## 2021-10-11 DIAGNOSIS — K642 Third degree hemorrhoids: Secondary | ICD-10-CM

## 2021-10-11 HISTORY — DX: Nausea with vomiting, unspecified: Z98.890

## 2021-10-11 HISTORY — PX: HEMORRHOID SURGERY: SHX153

## 2021-10-11 SURGERY — HEMORRHOIDECTOMY
Anesthesia: General | Site: Rectum

## 2021-10-11 MED ORDER — EPHEDRINE 5 MG/ML INJ
INTRAVENOUS | Status: AC
  Filled 2021-10-11: qty 15

## 2021-10-11 MED ORDER — PROPOFOL 10 MG/ML IV BOLUS
INTRAVENOUS | Status: DC | PRN
  Administered 2021-10-11: 150 mg via INTRAVENOUS
  Administered 2021-10-11: 50 mg via INTRAVENOUS

## 2021-10-11 MED ORDER — KETOROLAC TROMETHAMINE 30 MG/ML IJ SOLN
30.0000 mg | Freq: Once | INTRAMUSCULAR | Status: AC
  Administered 2021-10-11: 08:00:00 30 mg via INTRAVENOUS
  Filled 2021-10-11: qty 1

## 2021-10-11 MED ORDER — SURGILUBE EX GEL
CUTANEOUS | Status: DC | PRN
  Administered 2021-10-11: 1 via TOPICAL

## 2021-10-11 MED ORDER — FENTANYL CITRATE (PF) 100 MCG/2ML IJ SOLN
INTRAMUSCULAR | Status: AC
  Filled 2021-10-11: qty 2

## 2021-10-11 MED ORDER — ONDANSETRON HCL 4 MG/2ML IJ SOLN
INTRAMUSCULAR | Status: DC | PRN
  Administered 2021-10-11: 4 mg via INTRAVENOUS

## 2021-10-11 MED ORDER — OXYCODONE-ACETAMINOPHEN 5-325 MG PO TABS
1.0000 | ORAL_TABLET | Freq: Once | ORAL | Status: AC
  Administered 2021-10-11: 09:00:00 1 via ORAL

## 2021-10-11 MED ORDER — DEXAMETHASONE SODIUM PHOSPHATE 10 MG/ML IJ SOLN
INTRAMUSCULAR | Status: DC | PRN
  Administered 2021-10-11: 8 mg via INTRAVENOUS

## 2021-10-11 MED ORDER — ONDANSETRON HCL 4 MG/2ML IJ SOLN: 4.0000 mg | Freq: Once | INTRAMUSCULAR | Status: DC | PRN

## 2021-10-11 MED ORDER — BUPIVACAINE LIPOSOME 1.3 % IJ SUSP
INTRAMUSCULAR | Status: AC
  Filled 2021-10-11: qty 20

## 2021-10-11 MED ORDER — ROCURONIUM BROMIDE 10 MG/ML (PF) SYRINGE
PREFILLED_SYRINGE | INTRAVENOUS | Status: AC
  Filled 2021-10-11: qty 10

## 2021-10-11 MED ORDER — LACTATED RINGERS IV SOLN: INTRAVENOUS | Status: DC

## 2021-10-11 MED ORDER — LIDOCAINE VISCOUS HCL 2 % MT SOLN
OROMUCOSAL | Status: AC
  Filled 2021-10-11: qty 15

## 2021-10-11 MED ORDER — FENTANYL CITRATE PF 50 MCG/ML IJ SOSY
25.0000 ug | PREFILLED_SYRINGE | INTRAMUSCULAR | Status: DC | PRN
  Administered 2021-10-11: 08:00:00 50 ug via INTRAVENOUS
  Filled 2021-10-11: qty 1

## 2021-10-11 MED ORDER — FENTANYL CITRATE (PF) 100 MCG/2ML IJ SOLN
INTRAMUSCULAR | Status: DC | PRN
  Administered 2021-10-11: 100 ug via INTRAVENOUS

## 2021-10-11 MED ORDER — SUGAMMADEX SODIUM 500 MG/5ML IV SOLN
INTRAVENOUS | Status: AC
  Filled 2021-10-11: qty 15

## 2021-10-11 MED ORDER — ESMOLOL HCL 100 MG/10ML IV SOLN
INTRAVENOUS | Status: AC
  Filled 2021-10-11: qty 20

## 2021-10-11 MED ORDER — SODIUM CHLORIDE 0.9 % IR SOLN
Status: DC | PRN
  Administered 2021-10-11: 1000 mL

## 2021-10-11 MED ORDER — SODIUM CHLORIDE 0.9 % IV SOLN
1.0000 g | INTRAVENOUS | Status: AC
  Administered 2021-10-11: 08:00:00 1 g via INTRAVENOUS
  Filled 2021-10-11: qty 1

## 2021-10-11 MED ORDER — ORAL CARE MOUTH RINSE: 15.0000 mL | Freq: Once | OROMUCOSAL | Status: AC

## 2021-10-11 MED ORDER — PHENYLEPHRINE 40 MCG/ML (10ML) SYRINGE FOR IV PUSH (FOR BLOOD PRESSURE SUPPORT)
PREFILLED_SYRINGE | INTRAVENOUS | Status: AC
  Filled 2021-10-11: qty 10

## 2021-10-11 MED ORDER — MIDAZOLAM HCL 2 MG/2ML IJ SOLN
INTRAMUSCULAR | Status: AC
  Filled 2021-10-11: qty 2

## 2021-10-11 MED ORDER — BUPIVACAINE LIPOSOME 1.3 % IJ SUSP
INTRAMUSCULAR | Status: DC | PRN
  Administered 2021-10-11: 20 mL

## 2021-10-11 MED ORDER — GLYCOPYRROLATE PF 0.2 MG/ML IJ SOSY
PREFILLED_SYRINGE | INTRAMUSCULAR | Status: AC
  Filled 2021-10-11: qty 2

## 2021-10-11 MED ORDER — OXYCODONE-ACETAMINOPHEN 5-325 MG PO TABS
ORAL_TABLET | ORAL | Status: AC
  Filled 2021-10-11: qty 1

## 2021-10-11 MED ORDER — OXYCODONE-ACETAMINOPHEN 5-325 MG PO TABS: 1.0000 | ORAL_TABLET | ORAL | 0 refills | Status: AC | PRN

## 2021-10-11 MED ORDER — PROPOFOL 10 MG/ML IV BOLUS
INTRAVENOUS | Status: AC
  Filled 2021-10-11: qty 40

## 2021-10-11 MED ORDER — CHLORHEXIDINE GLUCONATE CLOTH 2 % EX PADS: 6.0000 | MEDICATED_PAD | Freq: Once | CUTANEOUS | Status: DC

## 2021-10-11 MED ORDER — LIDOCAINE VISCOUS HCL 2 % MT SOLN
OROMUCOSAL | Status: DC | PRN
  Administered 2021-10-11: 1

## 2021-10-11 MED ORDER — CHLORHEXIDINE GLUCONATE 0.12 % MT SOLN
15.0000 mL | Freq: Once | OROMUCOSAL | Status: AC
  Administered 2021-10-11: 07:00:00 15 mL via OROMUCOSAL

## 2021-10-11 MED ORDER — METOCLOPRAMIDE HCL 5 MG/ML IJ SOLN
INTRAMUSCULAR | Status: AC
  Filled 2021-10-11: qty 2

## 2021-10-11 SURGICAL SUPPLY — 25 items
CLOTH BEACON ORANGE TIMEOUT ST (SAFETY) ×2 IMPLANT
COVER LIGHT HANDLE STERIS (MISCELLANEOUS) ×4 IMPLANT
DRAPE HALF SHEET 40X57 (DRAPES) ×2 IMPLANT
ELECT REM PT RETURN 9FT ADLT (ELECTROSURGICAL) ×2
ELECTRODE REM PT RTRN 9FT ADLT (ELECTROSURGICAL) ×1 IMPLANT
GAUZE 4X4 16PLY ~~LOC~~+RFID DBL (SPONGE) ×2 IMPLANT
GAUZE SPONGE 4X4 12PLY STRL (GAUZE/BANDAGES/DRESSINGS) ×4 IMPLANT
GLOVE SURG LTX SZ6.5 (GLOVE) ×4 IMPLANT
GLOVE SURG POLYISO LF SZ7.5 (GLOVE) ×2 IMPLANT
GLOVE SURG UNDER POLY LF SZ7 (GLOVE) ×6 IMPLANT
GOWN STRL REUS W/TWL LRG LVL3 (GOWN DISPOSABLE) ×6 IMPLANT
HEMOSTAT SURGICEL 4X8 (HEMOSTASIS) ×2 IMPLANT
KIT TURNOVER CYSTO (KITS) ×2 IMPLANT
LIGASURE IMPACT 36 18CM CVD LR (INSTRUMENTS) ×2 IMPLANT
MANIFOLD NEPTUNE II (INSTRUMENTS) ×2 IMPLANT
NEEDLE HYPO 18GX1.5 BLUNT FILL (NEEDLE) ×2 IMPLANT
NEEDLE HYPO 21X1.5 SAFETY (NEEDLE) ×2 IMPLANT
NS IRRIG 1000ML POUR BTL (IV SOLUTION) ×2 IMPLANT
PACK PERI GYN (CUSTOM PROCEDURE TRAY) ×2 IMPLANT
PAD ARMBOARD 7.5X6 YLW CONV (MISCELLANEOUS) ×2 IMPLANT
SET BASIN LINEN APH (SET/KITS/TRAYS/PACK) ×2 IMPLANT
SURGILUBE 2OZ TUBE FLIPTOP (MISCELLANEOUS) ×2 IMPLANT
SUT SILK 0 FSL (SUTURE) ×2 IMPLANT
SUT VIC AB 2-0 CT2 27 (SUTURE) ×4 IMPLANT
SYR 20ML LL LF (SYRINGE) ×4 IMPLANT

## 2021-10-11 NOTE — Op Note (Signed)
Patient:  Carrie Mclaughlin  DOB:  22-Jun-1971  MRN:  [id number]   Preop Diagnosis: Prolapsing hemorrhoids  Postop Diagnosis: Same  Procedure: Extensive hemorrhoidectomy  Surgeon: Aviva Signs, MD  Anes: General  Indications: Patient is a 50 year old white female who presents with prolapsing and bleeding hemorrhoidal disease.  The risks and benefits of the procedure including bleeding, infection, and recurrence of the hemorrhoidal disease were fully explained to the patient, who gave informed consent.  Procedure note: The patient was placed in the lithotomy position after general anesthesia was administered.  The perineum was prepped and draped using the usual sterile technique with Betadine.  Surgical site confirmation was performed.  On rectal examination, the patient had prominent hemorrhoids at the 11 o'clock position and 6:00 positions.  No active bleeding was noted.  Other smaller internal hemorrhoids were noted.  I elected to proceed with the 2 most prominent hemorrhoids.  Using the LigaSure, both the 6:00 and 11:00 internal and external hemorrhoids were excised in a column like fashion.  Care was taken to avoid the external sphincter mechanism.  Given the large size of the 6:00 hemorrhoids, several 2-0 Vicryl sutures were used to reapproximate the mucosal edges.  Exparel was instilled into the surrounding perineum.  Surgicel and viscous Xylocaine rectal packing was then placed.  All tape and needle counts were correct at the end of the procedure.  Patient was awakened and transferred to PACU in stable condition.  Complications: None  EBL: Minimal  Specimen: Hemorrhoids

## 2021-10-11 NOTE — Anesthesia Postprocedure Evaluation (Signed)
Anesthesia Post Note  Patient: Carrie Mclaughlin  Procedure(s) Performed: HEMORRHOIDECTOMY; EXTENSIVE (Rectum)  Patient location during evaluation: Phase II Anesthesia Type: General Level of consciousness: awake Pain management: pain level controlled Vital Signs Assessment: post-procedure vital signs reviewed and stable Respiratory status: spontaneous breathing and respiratory function stable Cardiovascular status: blood pressure returned to baseline and stable Postop Assessment: no headache and no apparent nausea or vomiting Anesthetic complications: no Comments: Late entry   No notable events documented.   Last Vitals:  Vitals:   10/11/21 0828 10/11/21 0830  BP: 108/71   Pulse: 66 66  Resp: (!) 25 20  Temp:    SpO2: 100% 100%    Last Pain:  Vitals:   10/11/21 0818  TempSrc:   PainSc: [place]

## 2021-10-11 NOTE — Transfer of Care (Signed)
Immediate Anesthesia Transfer of Care Note  Patient: Carrie Mclaughlin  Procedure(s) Performed: HEMORRHOIDECTOMY; EXTENSIVE (Rectum)  Patient Location: PACU  Anesthesia Type:General  Level of Consciousness: awake and patient cooperative  Airway & Oxygen Therapy: Patient Spontanous Breathing  Post-op Assessment: Report given to RN and Post -op Vital signs reviewed and stable  Post vital signs: Reviewed and stable  Last Vitals:  Vitals Value Taken Time  BP    Temp 97.4   Pulse 76 10/11/21 0818  Resp 16 10/11/21 0818  SpO2 100 % 10/11/21 0818  Vitals shown include unvalidated device data.  Last Pain:  Vitals:   10/11/21 0655  TempSrc: Oral  PainSc: 0-No pain      Patients Stated Pain Goal: 6 (09/29/51 0802)  Complications: No notable events documented.

## 2021-10-11 NOTE — Anesthesia Procedure Notes (Signed)
Procedure Name: LMA Insertion Date/Time: 10/11/2021 7:38 AM Performed by: Vista Deck, CRNA Pre-anesthesia Checklist: Patient identified, Patient being monitored, Emergency Drugs available, Timeout performed and Suction available Patient Re-evaluated:Patient Re-evaluated prior to induction Oxygen Delivery Method: Circle System Utilized Preoxygenation: Pre-oxygenation with 100% oxygen Induction Type: IV induction Ventilation: Mask ventilation without difficulty LMA: LMA inserted LMA Size: 4.0 Number of attempts: 1 Placement Confirmation: positive ETCO2 and breath sounds checked- equal and bilateral Tube secured with: Tape Dental Injury: Teeth and Oropharynx as per pre-operative assessment

## 2021-10-11 NOTE — Interval H&P Note (Signed)
History and Physical Interval Note:  10/11/2021 7:14 AM  Carrie Mclaughlin  has presented today for surgery, with the diagnosis of Internal and external hemorrhoids.  The various methods of treatment have been discussed with the patient and family. After consideration of risks, benefits and other options for treatment, the patient has consented to  Procedure(s): HEMORRHOIDECTOMY; EXTENSIVE (N/A) as a surgical intervention.  The patient's history has been reviewed, patient examined, no change in status, stable for surgery.  I have reviewed the patient's chart and labs.  Questions were answered to the patient's satisfaction.     Aviva Signs

## 2021-10-11 NOTE — Anesthesia Preprocedure Evaluation (Signed)
Anesthesia Evaluation  Patient identified by MRN, date of birth, ID band Patient awake    Reviewed: Allergy & Precautions, H&P , NPO status , Patient's Chart, lab work & pertinent test results, reviewed documented beta blocker date and time   History of Anesthesia Complications (+) PONV and history of anesthetic complications  Airway Mallampati: II  TM Distance: >3 FB Neck ROM: full    Dental no notable dental hx.    Pulmonary neg pulmonary ROS,    Pulmonary exam normal breath sounds clear to auscultation       Cardiovascular Exercise Tolerance: Good negative cardio ROS   Rhythm:regular Rate:Normal     Neuro/Psych negative neurological ROS  negative psych ROS   GI/Hepatic negative GI ROS, Neg liver ROS,   Endo/Other  negative endocrine ROS  Renal/GU negative Renal ROS  negative genitourinary   Musculoskeletal   Abdominal   Peds  Hematology negative hematology ROS (+)   Anesthesia Other Findings   Reproductive/Obstetrics negative OB ROS                             Anesthesia Physical Anesthesia Plan  ASA: 2  Anesthesia Plan: General and General LMA   Post-op Pain Management:    Induction:   PONV Risk Score and Plan: Ondansetron  Airway Management Planned:   Additional Equipment:   Intra-op Plan:   Post-operative Plan:   Informed Consent: I have reviewed the patients History and Physical, chart, labs and discussed the procedure including the risks, benefits and alternatives for the proposed anesthesia with the patient or authorized representative who has indicated his/her understanding and acceptance.     Dental Advisory Given  Plan Discussed with: CRNA  Anesthesia Plan Comments:         Anesthesia Quick Evaluation

## 2021-10-12 ENCOUNTER — Encounter (HOSPITAL_COMMUNITY): Payer: Self-pay | Admitting: General Surgery

## 2021-10-12 LAB — SURGICAL PATHOLOGY

## 2021-10-22 ENCOUNTER — Telehealth (INDEPENDENT_AMBULATORY_CARE_PROVIDER_SITE_OTHER): Payer: BC Managed Care – PPO | Admitting: General Surgery

## 2021-10-22 DIAGNOSIS — Z09 Encounter for follow-up examination after completed treatment for conditions other than malignant neoplasm: Secondary | ICD-10-CM

## 2021-10-22 NOTE — Telephone Encounter (Signed)
Postoperative telephone visit performed with patient.  Patient states she is doing very well.  She has been recovering well and has no nausea or significant rectal pain present.  She denies any blood per rectum.  She is going to follow-up with me in 4 to 6 weeks and let me know how she is doing.  As this was a part of the global surgical fee, this was not a billable visit.  Total telephone time was 8 minutes.

## 2021-12-05 ENCOUNTER — Encounter: Payer: Self-pay | Admitting: General Surgery

## 2021-12-06 ENCOUNTER — Other Ambulatory Visit (INDEPENDENT_AMBULATORY_CARE_PROVIDER_SITE_OTHER): Payer: BC Managed Care – PPO | Admitting: General Surgery

## 2021-12-06 DIAGNOSIS — Z09 Encounter for follow-up examination after completed treatment for conditions other than malignant neoplasm: Secondary | ICD-10-CM

## 2021-12-06 MED ORDER — FLUCONAZOLE 100 MG PO TABS: 100.0000 mg | ORAL_TABLET | Freq: Every day | ORAL | 1 refills | Status: AC

## 2021-12-06 NOTE — Progress Notes (Signed)
Diflcucan ordered for perineal yeast infection.
